# Patient Record
Sex: Male | Born: 1935 | Race: White | Hispanic: No | Marital: Married
Health system: Southern US, Community
[De-identification: ages and names within clinical notes are randomized; demographics above are authoritative.]

---

## 2003-08-02 ENCOUNTER — Encounter: Admission: RE | Admit: 2003-08-02 | Discharge: 2003-08-02 | Payer: Self-pay | Admitting: Specialist

## 2004-02-22 ENCOUNTER — Ambulatory Visit: Payer: Self-pay

## 2004-09-21 ENCOUNTER — Encounter: Admission: RE | Admit: 2004-09-21 | Discharge: 2004-09-21 | Payer: Self-pay | Admitting: Internal Medicine

## 2005-01-22 ENCOUNTER — Ambulatory Visit: Payer: Self-pay | Admitting: Ophthalmology

## 2005-02-07 ENCOUNTER — Ambulatory Visit (HOSPITAL_COMMUNITY): Admission: RE | Admit: 2005-02-07 | Discharge: 2005-02-07 | Payer: Self-pay | Admitting: Internal Medicine

## 2005-03-09 ENCOUNTER — Encounter: Admission: RE | Admit: 2005-03-09 | Discharge: 2005-03-09 | Payer: Self-pay | Admitting: Gastroenterology

## 2005-03-26 ENCOUNTER — Encounter: Admission: RE | Admit: 2005-03-26 | Discharge: 2005-03-26 | Payer: Self-pay | Admitting: Gastroenterology

## 2005-05-28 ENCOUNTER — Ambulatory Visit: Payer: Self-pay | Admitting: Unknown Physician Specialty

## 2006-01-28 ENCOUNTER — Ambulatory Visit: Payer: Self-pay

## 2006-07-12 ENCOUNTER — Ambulatory Visit: Payer: Self-pay | Admitting: Unknown Physician Specialty

## 2006-10-22 ENCOUNTER — Encounter: Admission: RE | Admit: 2006-10-22 | Discharge: 2006-10-22 | Payer: Self-pay | Admitting: Gastroenterology

## 2007-12-15 ENCOUNTER — Emergency Department: Payer: Self-pay

## 2007-12-21 ENCOUNTER — Inpatient Hospital Stay (HOSPITAL_COMMUNITY): Admission: EM | Admit: 2007-12-21 | Discharge: 2007-12-24 | Payer: Self-pay | Admitting: Emergency Medicine

## 2010-05-16 NOTE — H&P (Signed)
NAME:  Brett Haney, Brett Haney                   ACCOUNT NO.:  [id number]   MEDICAL RECORD NO.:  [id number]          PATIENT TYPE:  EMS   LOCATION:  MAJO                         FACILITY:  [facility]   PHYSICIAN:  Corinna L. Lendell Caprice, MDDATE OF BIRTH:  [birth year]   DATE OF ADMISSION:  12/21/2007  DATE OF DISCHARGE:                              HISTORY & PHYSICAL   CHIEF COMPLAINT:  Fall last week.   HISTORY OF PRESENT ILLNESS:  Brett Haney is an 77 year old white male who  presents to the emergency room with an unclear chief complaint.  He is a  difficult historian and I presume demented.  His wife does not really  help much with respect to the story.  I have reviewed an outpatient note  from a few days ago by Dr. Earl Gala.  He reportedly fell last week and  apparently was nauseated last week which apparently is a chronic  problem.  However, when he arrived in the emergency room he was noted to  be febrile.  He denies cough.  He reports anorexia.  He is not currently  nauseated.  He denies shortness of breath.  He denies fevers, chills,  myalgias, vomiting, diarrhea.   PAST MEDICAL HISTORY:  Per outpatient records include hyperlipidemia,  osteoarthritis, anemia, obstructive sleep apnea, noncompliant with CPAP,  gastroparesis, BPH, peripheral neuropathy, hearing loss, lumbar  radiculopathy with lumbar fracture in 1996.   MEDICATIONS PER OUTPATIENT RECORDS:  1. Aspirin 81 mg daily.  2. Xalatan 0.005% one drop to the affected eye once nightly.  3. Alphagan 0.1% one drop to the affected eye t.i.d.  4. Timolol 0.5% one drop to both eyes daily.  5. Restasis 0.05% one drop to affected eye twice a day.  6. Multivitamin daily.  7. Vitamin E 400 units daily.  8. Calcium with vitamin D 600/200 mg twice a day.  9. Glucosamine chondroitin 2 tablets daily.  10.Reglan 5 mg twice a day.  11.Proscar 5 mg a day.  12.Baclofen 20 mg nightly.  13.Kadian 20 mg twice a day.  14.Celebrex 400 mg a day.  15.Provigil 200  mg twice a day.  16.Mirapex 0.5 mg a day.  17.Apparently also domperidone 10 mg six times a day.   SOCIAL HISTORY:  The patient does not smoke.  He is here with his wife.  Apparently there is a nurse in the family with some care.  He drinks  occasionally.   FAMILY HISTORY:  Noncontributory.   REVIEW OF SYSTEMS:  Is limited and difficult due to his dementia.   PHYSICAL EXAMINATION:  VITAL SIGNS:  Temperature is 101.9, blood  pressure 119/66, pulse 100, respiratory rate 18, oxygen saturation 92%  on room air.  GENERAL:  The patient is well-nourished, well-developed in no acute  distress.  HEENT:  Normocephalic, atraumatic.  Pupils equal, round, reactive to  light.  Sclerae nonicteric.  No conjunctivitis.  Moist mucous membranes.  Oropharynx is without erythema or exudate.  NECK:  Neck is supple.  No lymphadenopathy.  No JVD.  LUNGS:  Clear on the left.  He has rales on the right  base.  No wheezes  or rhonchi.  CARDIOVASCULAR:  Regular rate and rhythm without murmurs, gallops or  rubs.  ABDOMEN:  Normal bowel sounds, soft, nontender, nondistended.  GU:  Deferred.  RECTAL:  Deferred.  EXTREMITIES:  No clubbing, cyanosis or edema.  SKIN:  No rash.  Warm.  No diaphoresis.  NEUROLOGIC:  The patient is alert.  He is oriented to person only.  Cranial nerves and sensorimotor exam are intact.  PSYCHIATRIC:  Normal affect, calm and cooperative.   LABS:  White blood cell count 11,000, hemoglobin 11.3, hematocrit 33,  platelet count 130, 92% neutrophils.  Sodium 132, glucose 130, total  protein 5.8, otherwise unremarkable complete metabolic panel.  Urinalysis showed 15 ketones, otherwise negative.   Chest x-ray two views shows right upper lobe and right lower lobe  multifocal pneumonia with tiny parapneumonic effusion, background  emphysematous change.   ASSESSMENT AND PLAN:  1. Pneumonia:  The patient is elderly and apparently fell last week.      He is a difficult historian and it is  reasonable to admit him for      IV antibiotics and to assess further.  He was slightly hypoxic and      I will continue oxygen.  2. Obstructive sleep apnea.  3. Osteoarthritis.  4. Presumed dementia.  5. Gastroparesis.  Continue Reglan, give Zofran as needed.  6. Benign prostatic hypertrophy.      Corinna L. Lendell Caprice, MD  Electronically Signed     CLS/MEDQ  D:  12/21/2007  T:  12/21/2007  Job:  [id number]

## 2010-05-16 NOTE — Discharge Summary (Signed)
NAME:  Brett Haney, Brett Haney                   ACCOUNT NO.:  [id number]   MEDICAL RECORD NO.:  [id number]          PATIENT TYPE:  INP   LOCATION:  5149                         FACILITY:  [facility]   PHYSICIAN:  Theressa Millard, M.D.    DATE OF BIRTH:  [birth year]   DATE OF ADMISSION:  12/21/2007  DATE OF DISCHARGE:  12/24/2007                               DISCHARGE SUMMARY   ADMITTING DIAGNOSIS:  Pneumonia.   DISCHARGE DIAGNOSES:  1. Community-acquired pneumonia, organism unknown.  2. Delirium secondary to community-acquired pneumonia and/or Avelox.  3. Memory los, (?) secondary to medications, (?) secondary to      obstructive sleep apnea, (?) secondary to early dementing process      or combination of the above.  4. Severe idiopathic peripheral neuropathy.  5. Osteoarthritis.  6. Benign prostatic hypertrophy.  7. Severe obstructive sleep apnea, currently untreated.  8. Mild anemia.   The patient is an 77 year old white male who was admitted with  pneumonia.  He had increased confusion and fever, and he was brought to  Emergency Department.  He had a fever of 101.9 and hypoxemia that  correct with oxygen.   HOSPITAL COURSE:  The patient was admitted and blood cultures were  negative.  White count was never very elevated.  He never developed a  cough.  However, oxygenation improved considerably on Avelox.  He did  get a little more confused and it was unclear whether this was simply  due to being hospitalized on the top of some baseline dementia or  whether he had side effects of Avelox.  Avelox was discontinued and  changed to azithromycin and Ceftin.  He improved, but was still somewhat  confused.  He was continued on antibiotics and was discharged improved  condition.   In the office visit prior to his admission, it had been noted that he  was on Provigil 200 mg b.i.d. and his had been decreased to once a day.  He has been on a number of medicines that could affect cognition and we  were  beginning to work on possible underlying causes for cognitive  dysfunction including medications, sleep apnea, and an early dementing  process.   DISCHARGE MEDICATIONS:  1. Aspirin 81 mg daily.  2. Xalatan 0.005% 1 drop both eyes at bedtime.  3. Alphagan 0.1% 1 drop both eyes b.i.d.  4. Timolol 0.5% 1 drop both eyes daily.  5. Restasis 0.05% 1 drop both eyes b.i.d.  6. Multivitamin daily.  7. Vitamin E 400 units daily.  8. Calcium plus vitamin D 600/200 twice daily.  9. Glucosamine/chondroitin 2 tablets daily.  10.Reglan 5 mg b.i.d.  11.Proscar 5 mg daily.  12.Baclofen 20 mg at bedtime.  13.Kadian 20 mg b.i.d.  14.Celebrex 400 mg daily.  15.Provigil 200 mg daily.  16.Mirapex 0.5 mg daily.  17.Domperidone 10 mg 6 times a day.  18.Nexium 4 mg daily.  19.Ambien 10 mg nightly p.r.n.  20.Azithromycin 250 mg 1 tablet daily times 3 days.  21.Ceftin 250 mg 1 tablet b.i.d. x5 days.   FOLLOWUP:  He will call to  make an appointment to see me in the next  month and will do a followup chest x-ray at that time.  He has an  appointment with Dr. Adriana Simas, his neurologist, in [place] in early January  2010 as well.   ACTIVITY:  As tolerated.   DIET:  No added salt.      Theressa Millard, M.D.  Electronically Signed     JO/MEDQ  D:  12/24/2007  T:  12/24/2007  Job:  [id number]

## 2010-10-06 LAB — URINALYSIS, ROUTINE W REFLEX MICROSCOPIC
Glucose, UA: NEGATIVE mg/dL
Nitrite: NEGATIVE
Specific Gravity, Urine: 1.017 (ref 1.005–1.030)
Urobilinogen, UA: 0.2 mg/dL (ref 0.0–1.0)
pH: 8 (ref 5.0–8.0)

## 2010-10-06 LAB — CBC
HCT: 27.1 % — ABNORMAL LOW (ref 39.0–52.0)
HCT: 29.3 % — ABNORMAL LOW (ref 39.0–52.0)
HCT: 33.1 % — ABNORMAL LOW (ref 39.0–52.0)
Hemoglobin: 10.1 g/dL — ABNORMAL LOW (ref 13.0–17.0)
Hemoglobin: 9.5 g/dL — ABNORMAL LOW (ref 13.0–17.0)
MCHC: 34.4 g/dL (ref 30.0–36.0)
MCHC: 35.1 g/dL (ref 30.0–36.0)
MCV: 87 fL (ref 78.0–100.0)
MCV: 88.3 fL (ref 78.0–100.0)
MCV: 88.5 fL (ref 78.0–100.0)
Platelets: 114 10*3/uL — ABNORMAL LOW (ref 150–400)
Platelets: 116 10*3/uL — ABNORMAL LOW (ref 150–400)
Platelets: 130 10*3/uL — ABNORMAL LOW (ref 150–400)
RBC: 3.12 MIL/uL — ABNORMAL LOW (ref 4.22–5.81)
RBC: 3.31 MIL/uL — ABNORMAL LOW (ref 4.22–5.81)
RBC: 3.75 MIL/uL — ABNORMAL LOW (ref 4.22–5.81)
RDW: 14.5 % (ref 11.5–15.5)
RDW: 14.7 % (ref 11.5–15.5)
WBC: 7.4 10*3/uL (ref 4.0–10.5)

## 2010-10-06 LAB — FOLATE RBC: RBC Folate: 1684 ng/mL — ABNORMAL HIGH (ref 180–600)

## 2010-10-06 LAB — COMPREHENSIVE METABOLIC PANEL
ALT: 21 U/L (ref 0–53)
BUN: 21 mg/dL (ref 6–23)
CO2: 25 mEq/L (ref 19–32)
Calcium: 8.6 mg/dL (ref 8.4–10.5)
Creatinine, Ser: 0.92 mg/dL (ref 0.4–1.5)
Glucose, Bld: 130 mg/dL — ABNORMAL HIGH (ref 70–99)
Sodium: 132 mEq/L — ABNORMAL LOW (ref 135–145)
Total Bilirubin: 1 mg/dL (ref 0.3–1.2)

## 2010-10-06 LAB — CULTURE, BLOOD (ROUTINE X 2)
Culture: NO GROWTH
Culture: NO GROWTH

## 2010-10-06 LAB — FERRITIN: Ferritin: 174 ng/mL (ref 22–322)

## 2010-10-06 LAB — DIFFERENTIAL
Eosinophils Absolute: 0 10*3/uL (ref 0.0–0.7)
Eosinophils Relative: 0 % (ref 0–5)
Lymphocytes Relative: 4 % — ABNORMAL LOW (ref 12–46)
Monocytes Absolute: 0.4 10*3/uL (ref 0.1–1.0)
Monocytes Relative: 4 % (ref 3–12)
Neutro Abs: 10.2 10*3/uL — ABNORMAL HIGH (ref 1.7–7.7)
Neutrophils Relative %: 92 % — ABNORMAL HIGH (ref 43–77)

## 2010-10-06 LAB — BASIC METABOLIC PANEL
BUN: 20 mg/dL (ref 6–23)
GFR calc non Af Amer: >60 mL/min (ref >60)
Glucose, Bld: 164 mg/dL — ABNORMAL HIGH (ref 70–99)
Potassium: 3.5 mEq/L (ref 3.5–5.1)

## 2010-10-06 LAB — IRON AND TIBC
Iron: 19 ug/dL — ABNORMAL LOW (ref 42–135)
Saturation Ratios: 9 % — ABNORMAL LOW (ref 20–55)
TIBC: 219 ug/dL (ref 215–435)
UIBC: 200 ug/dL

## 2010-10-06 LAB — TSH: TSH: 1.215 u[IU]/mL (ref 0.350–4.500)

## 2010-10-06 LAB — VITAMIN B12: Vitamin B-12: 301 pg/mL (ref 211–911)

## 2011-10-23 ENCOUNTER — Inpatient Hospital Stay: Payer: Self-pay | Admitting: General Practice

## 2011-10-23 LAB — COMPREHENSIVE METABOLIC PANEL
Albumin: 3.5 g/dL (ref 3.4–5.0)
Alkaline Phosphatase: 121 U/L (ref 50–136)
Anion Gap: 7 (ref 7–16)
BUN: 25 mg/dL — ABNORMAL HIGH (ref 7–18)
Calcium, Total: 8.3 mg/dL — ABNORMAL LOW (ref 8.5–10.1)
Co2: 29 mmol/L (ref 21–32)
EGFR (Non-African Amer.): 59 — ABNORMAL LOW
Glucose: 112 mg/dL — ABNORMAL HIGH (ref 65–99)
Osmolality: 290 (ref 275–301)
Potassium: 4.1 mmol/L (ref 3.5–5.1)
SGOT(AST): 25 U/L (ref 15–37)
SGPT (ALT): 20 U/L (ref 12–78)
Sodium: 143 mmol/L (ref 136–145)

## 2011-10-23 LAB — CBC
HCT: 29.7 % — ABNORMAL LOW (ref 40.0–52.0)
HGB: 10.3 g/dL — ABNORMAL LOW (ref 13.0–18.0)
MCH: 30.3 pg (ref 26.0–34.0)
MCV: 87 fL (ref 80–100)
Platelet: 119 10*3/uL — ABNORMAL LOW (ref 150–440)
RBC: 3.42 10*6/uL — ABNORMAL LOW (ref 4.40–5.90)

## 2011-10-23 LAB — PROTIME-INR
INR: 1
Prothrombin Time: 14 secs (ref 11.5–14.7)

## 2011-10-25 LAB — BASIC METABOLIC PANEL
BUN: 16 mg/dL (ref 7–18)
Calcium, Total: 7.4 mg/dL — ABNORMAL LOW (ref 8.5–10.1)
EGFR (African American): >60
EGFR (Non-African Amer.): >60
Glucose: 108 mg/dL — ABNORMAL HIGH (ref 65–99)
Osmolality: 290 (ref 275–301)
Sodium: 145 mmol/L (ref 136–145)

## 2011-10-25 LAB — PLATELET COUNT: Platelet: 98 10*3/uL — ABNORMAL LOW (ref 150–440)

## 2011-10-26 LAB — BASIC METABOLIC PANEL
Calcium, Total: 7.5 mg/dL — ABNORMAL LOW (ref 8.5–10.1)
EGFR (Non-African Amer.): >60
Glucose: 94 mg/dL (ref 65–99)
Osmolality: 292 (ref 275–301)
Potassium: 3.2 mmol/L — ABNORMAL LOW (ref 3.5–5.1)

## 2011-10-26 LAB — MAGNESIUM: Magnesium: 1.5 mg/dL — ABNORMAL LOW

## 2011-10-27 LAB — CBC WITH DIFFERENTIAL/PLATELET
Basophil #: 0.1 10*3/uL (ref 0.0–0.1)
Basophil %: 1 %
HCT: 19.8 % — ABNORMAL LOW (ref 40.0–52.0)
HGB: 7.2 g/dL — ABNORMAL LOW (ref 13.0–18.0)
Lymphocyte #: 1.1 10*3/uL (ref 1.0–3.6)
Lymphocyte %: 19.5 %
MCHC: 36.2 g/dL — ABNORMAL HIGH (ref 32.0–36.0)
MCV: 85 fL (ref 80–100)
Monocyte %: 5.4 %
Neutrophil #: 3.8 10*3/uL (ref 1.4–6.5)
RBC: 2.33 10*6/uL — ABNORMAL LOW (ref 4.40–5.90)
RDW: 14.5 % (ref 11.5–14.5)
WBC: 5.4 10*3/uL (ref 3.8–10.6)

## 2011-10-27 LAB — BASIC METABOLIC PANEL
Anion Gap: 9 (ref 7–16)
BUN: 10 mg/dL (ref 7–18)
Chloride: 109 mmol/L — ABNORMAL HIGH (ref 98–107)
Co2: 25 mmol/L (ref 21–32)
Creatinine: 1.02 mg/dL (ref 0.60–1.30)
EGFR (Non-African Amer.): >60
Glucose: 112 mg/dL — ABNORMAL HIGH (ref 65–99)
Osmolality: 285 (ref 275–301)
Potassium: 3.2 mmol/L — ABNORMAL LOW (ref 3.5–5.1)
Sodium: 143 mmol/L (ref 136–145)

## 2011-10-28 LAB — BASIC METABOLIC PANEL
Anion Gap: 7 (ref 7–16)
BUN: 9 mg/dL (ref 7–18)
Chloride: 110 mmol/L — ABNORMAL HIGH (ref 98–107)
Co2: 27 mmol/L (ref 21–32)
Creatinine: 0.85 mg/dL (ref 0.60–1.30)
Potassium: 3.4 mmol/L — ABNORMAL LOW (ref 3.5–5.1)
Sodium: 144 mmol/L (ref 136–145)

## 2011-10-28 LAB — HEMOGLOBIN: HGB: 8 g/dL — ABNORMAL LOW (ref 13.0–18.0)

## 2011-10-29 LAB — BASIC METABOLIC PANEL
Anion Gap: 9 (ref 7–16)
BUN: 15 mg/dL (ref 7–18)
Co2: 27 mmol/L (ref 21–32)
Creatinine: 0.82 mg/dL (ref 0.60–1.30)
EGFR (African American): >60
Glucose: 108 mg/dL — ABNORMAL HIGH (ref 65–99)
Osmolality: 286 (ref 275–301)
Sodium: 143 mmol/L (ref 136–145)

## 2011-10-29 LAB — HEMOGLOBIN: HGB: 8.1 g/dL — ABNORMAL LOW (ref 13.0–18.0)

## 2011-10-30 DIAGNOSIS — S7290XA Unspecified fracture of unspecified femur, initial encounter for closed fracture: Secondary | ICD-10-CM

## 2011-10-30 DIAGNOSIS — F068 Other specified mental disorders due to known physiological condition: Secondary | ICD-10-CM

## 2011-10-30 DIAGNOSIS — G589 Mononeuropathy, unspecified: Secondary | ICD-10-CM

## 2011-10-30 DIAGNOSIS — K219 Gastro-esophageal reflux disease without esophagitis: Secondary | ICD-10-CM

## 2011-12-27 DIAGNOSIS — F0280 Dementia in other diseases classified elsewhere without behavioral disturbance: Secondary | ICD-10-CM

## 2011-12-27 DIAGNOSIS — G309 Alzheimer's disease, unspecified: Secondary | ICD-10-CM

## 2011-12-27 DIAGNOSIS — G589 Mononeuropathy, unspecified: Secondary | ICD-10-CM

## 2011-12-27 DIAGNOSIS — K219 Gastro-esophageal reflux disease without esophagitis: Secondary | ICD-10-CM

## 2011-12-27 DIAGNOSIS — N400 Enlarged prostate without lower urinary tract symptoms: Secondary | ICD-10-CM

## 2012-01-24 DIAGNOSIS — G309 Alzheimer's disease, unspecified: Secondary | ICD-10-CM

## 2012-01-24 DIAGNOSIS — F0280 Dementia in other diseases classified elsewhere without behavioral disturbance: Secondary | ICD-10-CM

## 2012-01-24 DIAGNOSIS — K219 Gastro-esophageal reflux disease without esophagitis: Secondary | ICD-10-CM

## 2012-01-24 DIAGNOSIS — G589 Mononeuropathy, unspecified: Secondary | ICD-10-CM

## 2012-01-24 DIAGNOSIS — N400 Enlarged prostate without lower urinary tract symptoms: Secondary | ICD-10-CM

## 2012-03-20 DIAGNOSIS — F0280 Dementia in other diseases classified elsewhere without behavioral disturbance: Secondary | ICD-10-CM

## 2012-03-20 DIAGNOSIS — N400 Enlarged prostate without lower urinary tract symptoms: Secondary | ICD-10-CM

## 2012-03-20 DIAGNOSIS — K219 Gastro-esophageal reflux disease without esophagitis: Secondary | ICD-10-CM

## 2012-03-20 DIAGNOSIS — G589 Mononeuropathy, unspecified: Secondary | ICD-10-CM

## 2012-03-20 DIAGNOSIS — G309 Alzheimer's disease, unspecified: Secondary | ICD-10-CM

## 2012-05-13 DIAGNOSIS — G479 Sleep disorder, unspecified: Secondary | ICD-10-CM

## 2012-06-04 ENCOUNTER — Ambulatory Visit: Payer: Self-pay | Admitting: Internal Medicine

## 2012-07-10 DIAGNOSIS — F22 Delusional disorders: Secondary | ICD-10-CM

## 2012-07-10 DIAGNOSIS — F29 Unspecified psychosis not due to a substance or known physiological condition: Secondary | ICD-10-CM

## 2012-09-09 DIAGNOSIS — G589 Mononeuropathy, unspecified: Secondary | ICD-10-CM

## 2012-09-09 DIAGNOSIS — F0390 Unspecified dementia without behavioral disturbance: Secondary | ICD-10-CM

## 2012-09-09 DIAGNOSIS — F0280 Dementia in other diseases classified elsewhere without behavioral disturbance: Secondary | ICD-10-CM

## 2012-09-09 DIAGNOSIS — N400 Enlarged prostate without lower urinary tract symptoms: Secondary | ICD-10-CM

## 2012-09-09 DIAGNOSIS — G309 Alzheimer's disease, unspecified: Secondary | ICD-10-CM

## 2012-11-02 IMAGING — CR RIGHT HIP - COMPLETE 2+ VIEW
1 series · 3 of 3 positions shown · non-contrast
Comparison: none

REASON FOR EXAM: pain, injury, fall
COMMENTS:

[Series 1: t hip ap right · 0.14mm/px · 3 of 3 slices shown]
[im 1/3]
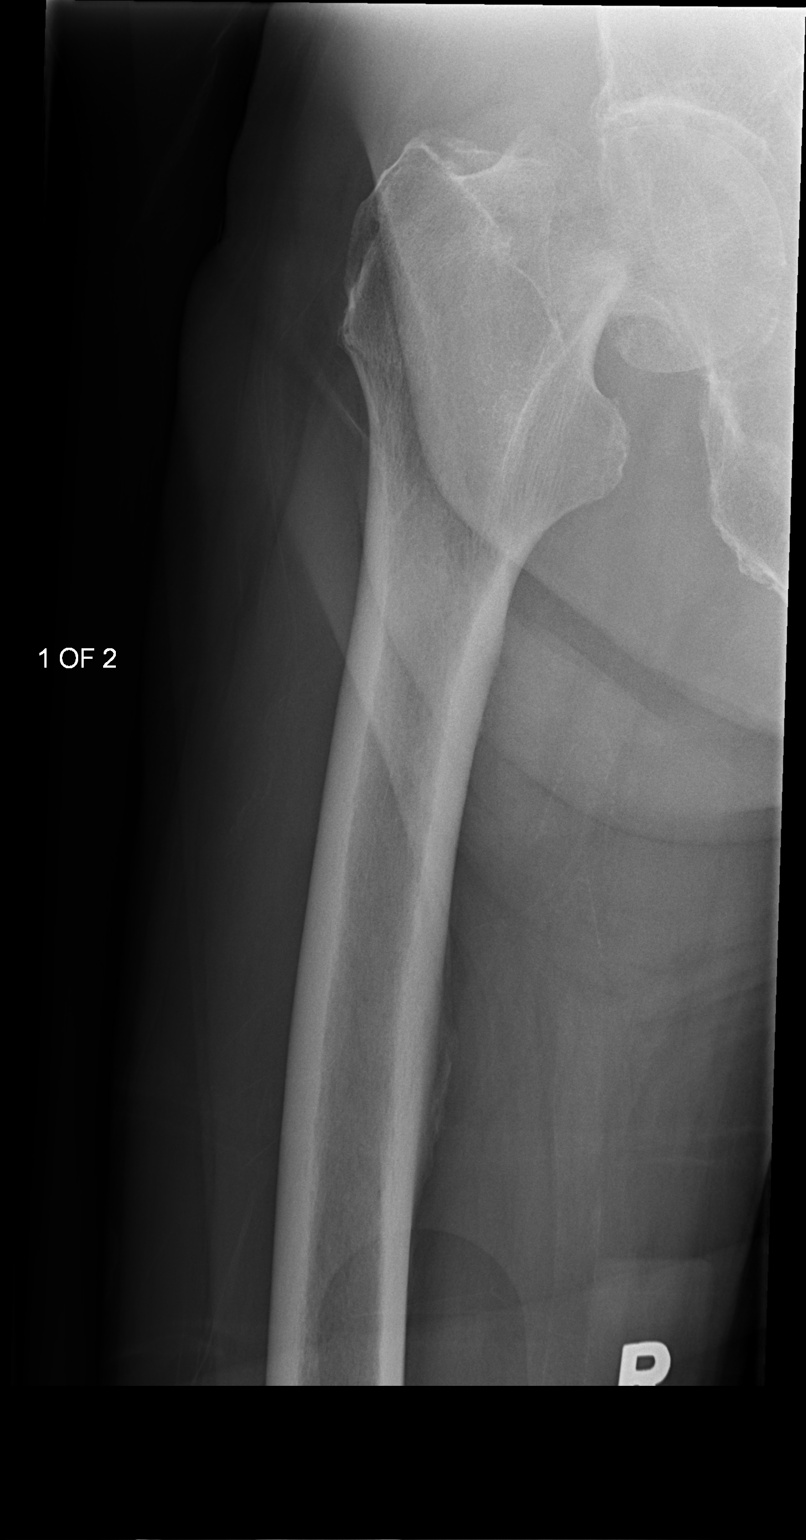
[im 2/3]
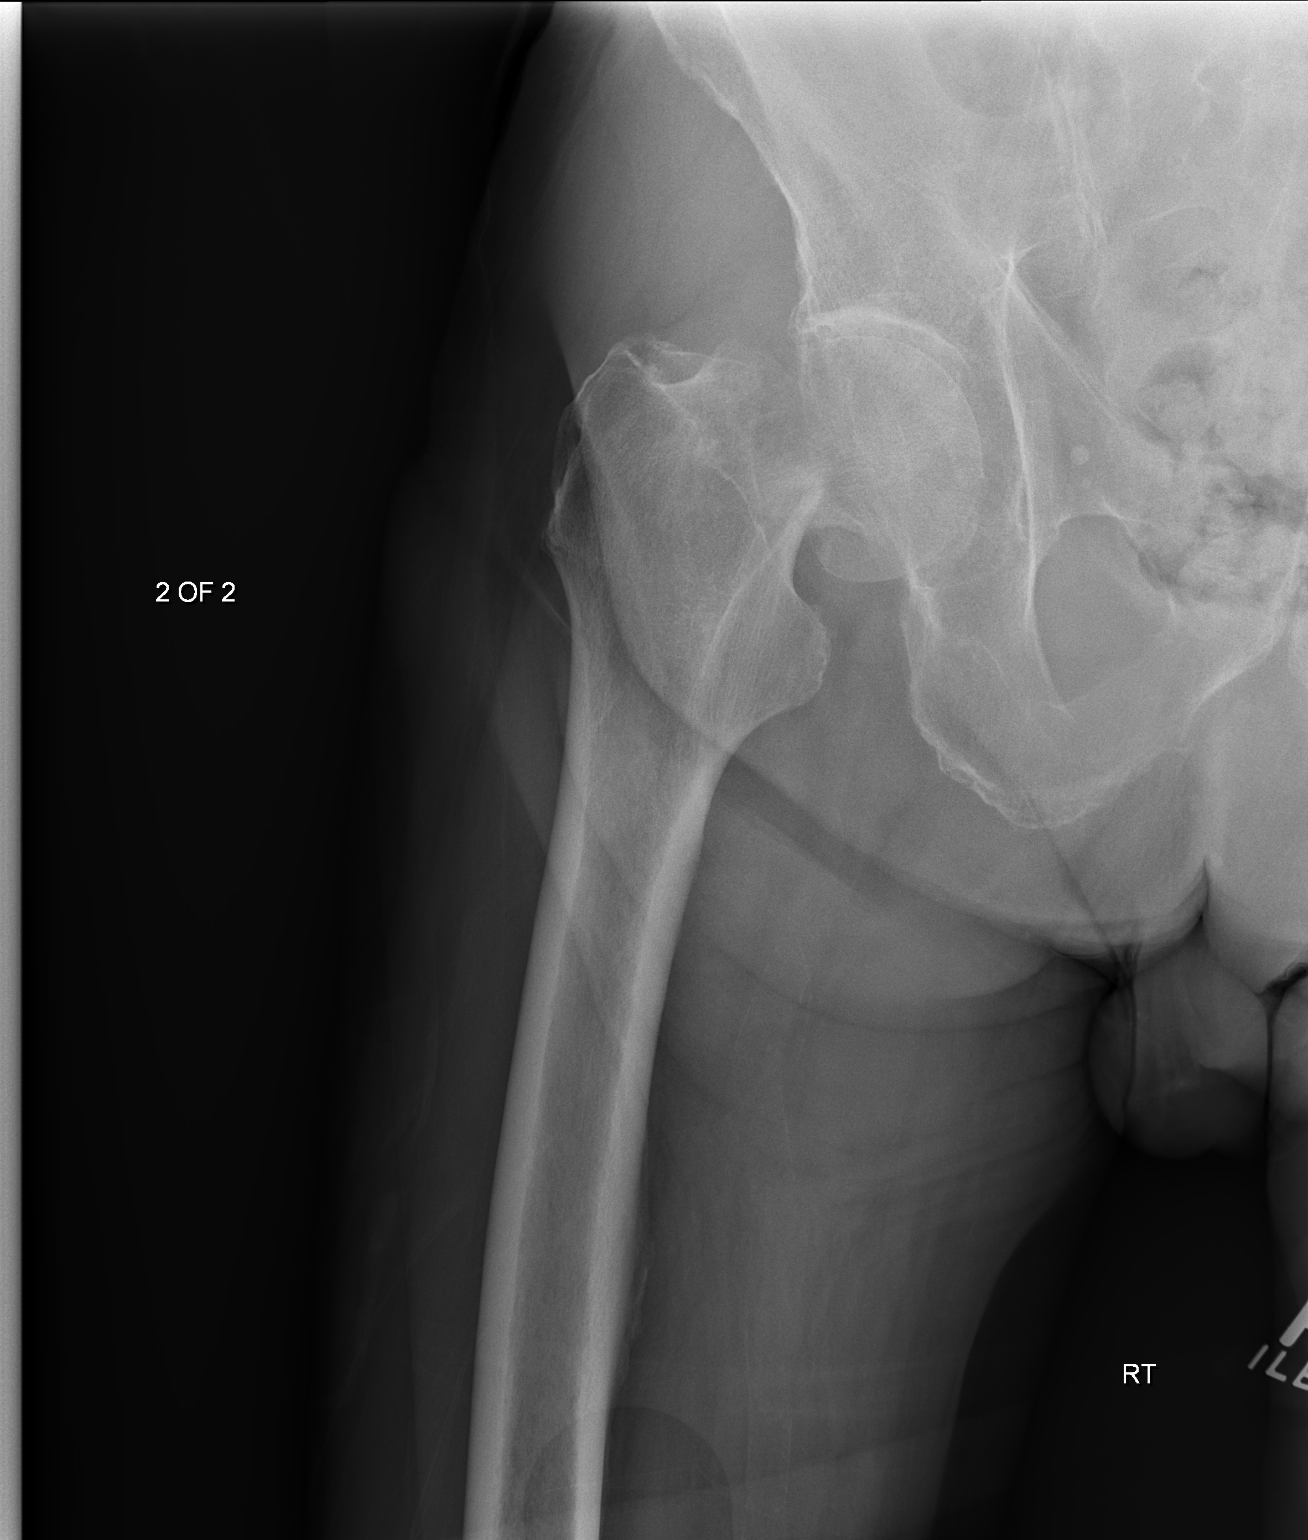
[im 3/3]
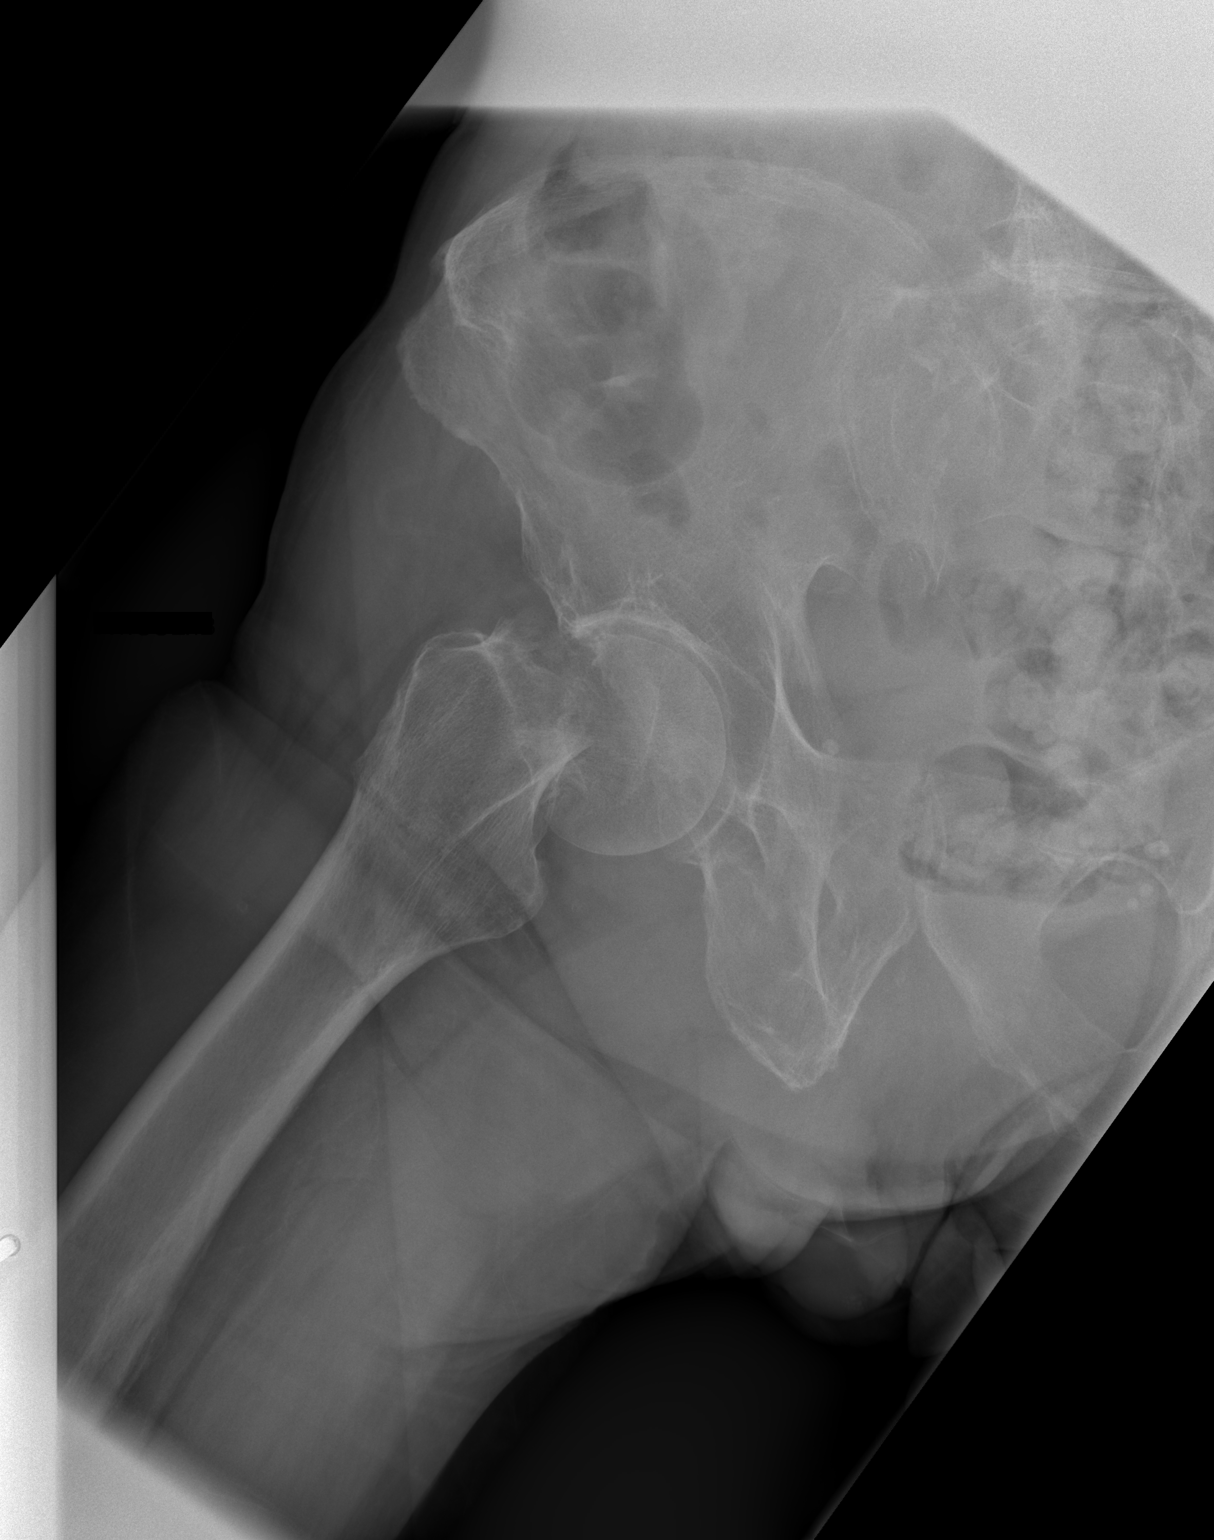

[3 of 3 positions shown; findings below may reference images not displayed]

PROCEDURE:     DXR - DXR HIP RIGHT COMPLETE  - October 23, 2011  [DATE]

RESULT:     Three views of the right hip are submitted. The patient has
sustained an acute angulated subcapital fracture. The intertrochanteric
region is intact. The observed portions of the right hemipelvis exhibit no
acute fractures. There is diffuse osteopenia.
IMPRESSION: The patient has sustained an acute subcapital fracture of
the right hip.

[REDACTED]

## 2012-11-03 IMAGING — CR RIGHT HIP - COMPLETE 2+ VIEW
1 series · 2 of 2 positions shown · non-contrast
Comparison: none

REASON FOR EXAM: s/p THA
COMMENTS:   Bedside (portable):Y

[Series 1: ap · 0.17mm/px · 2 of 2 slices shown]
[im 1/2]
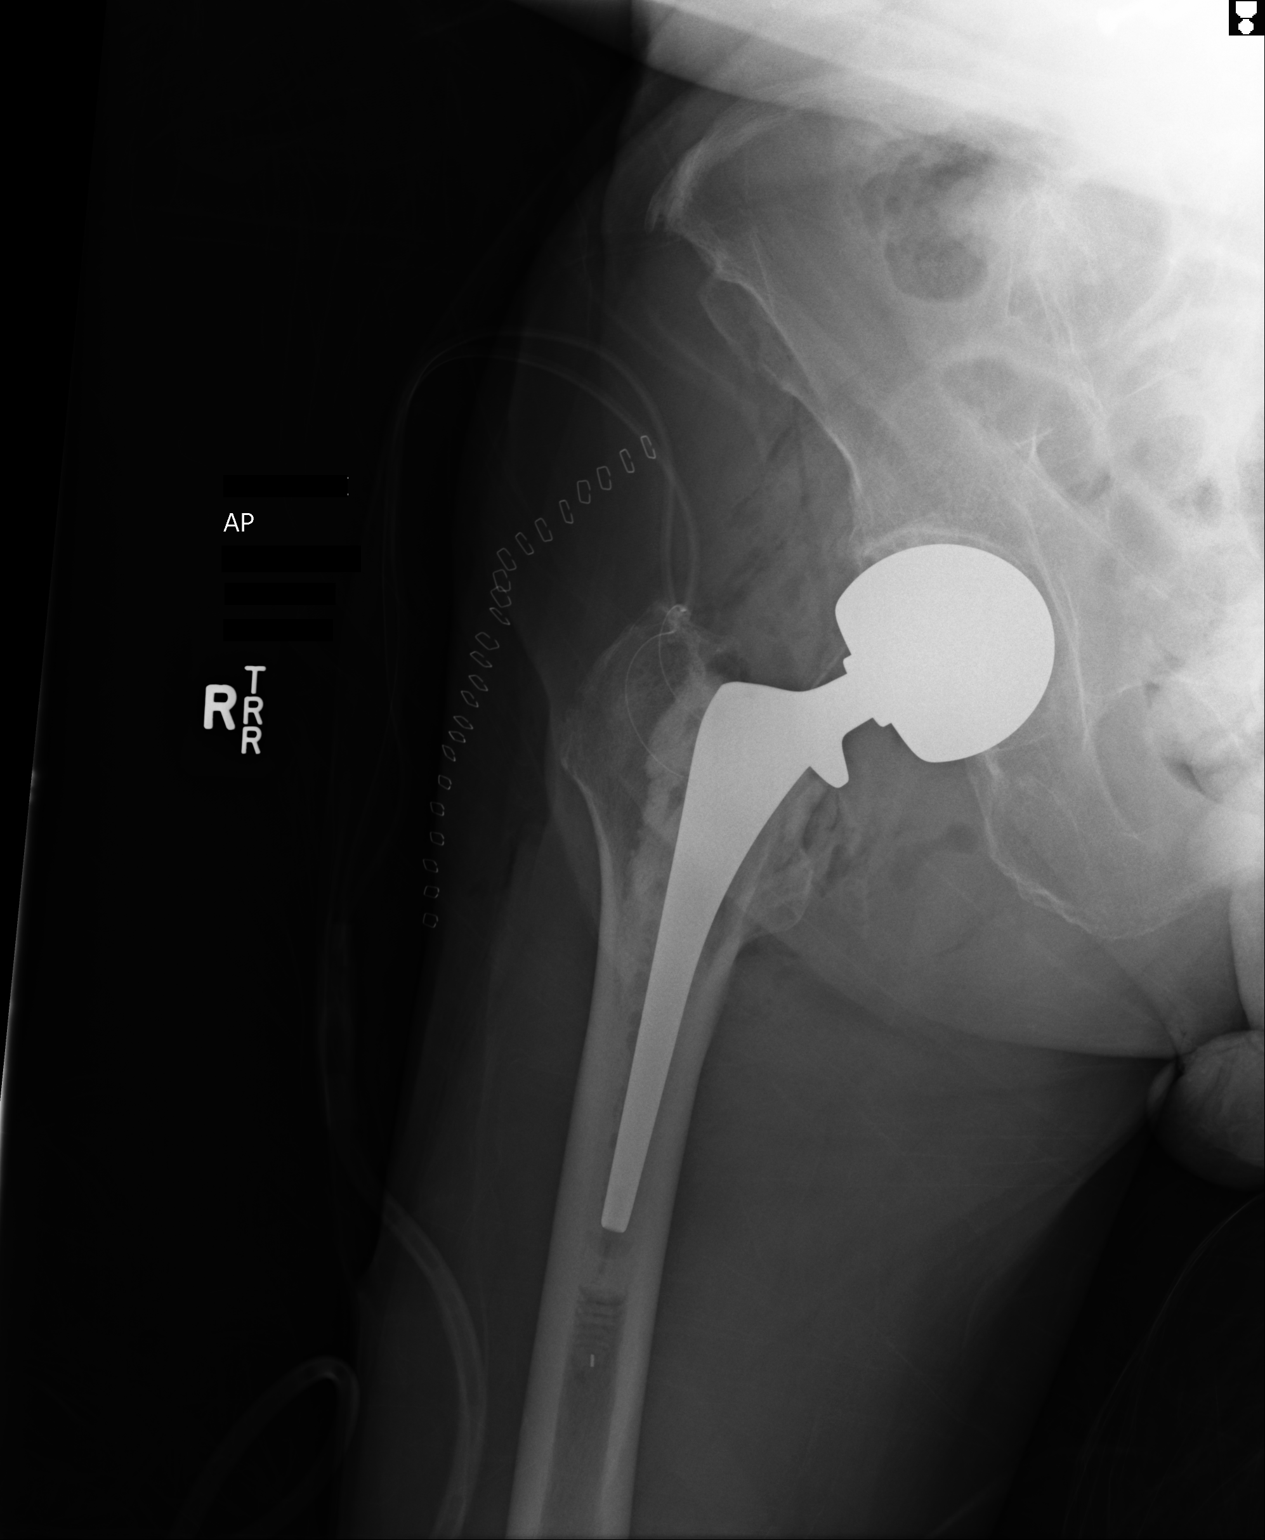
[im 2/2]
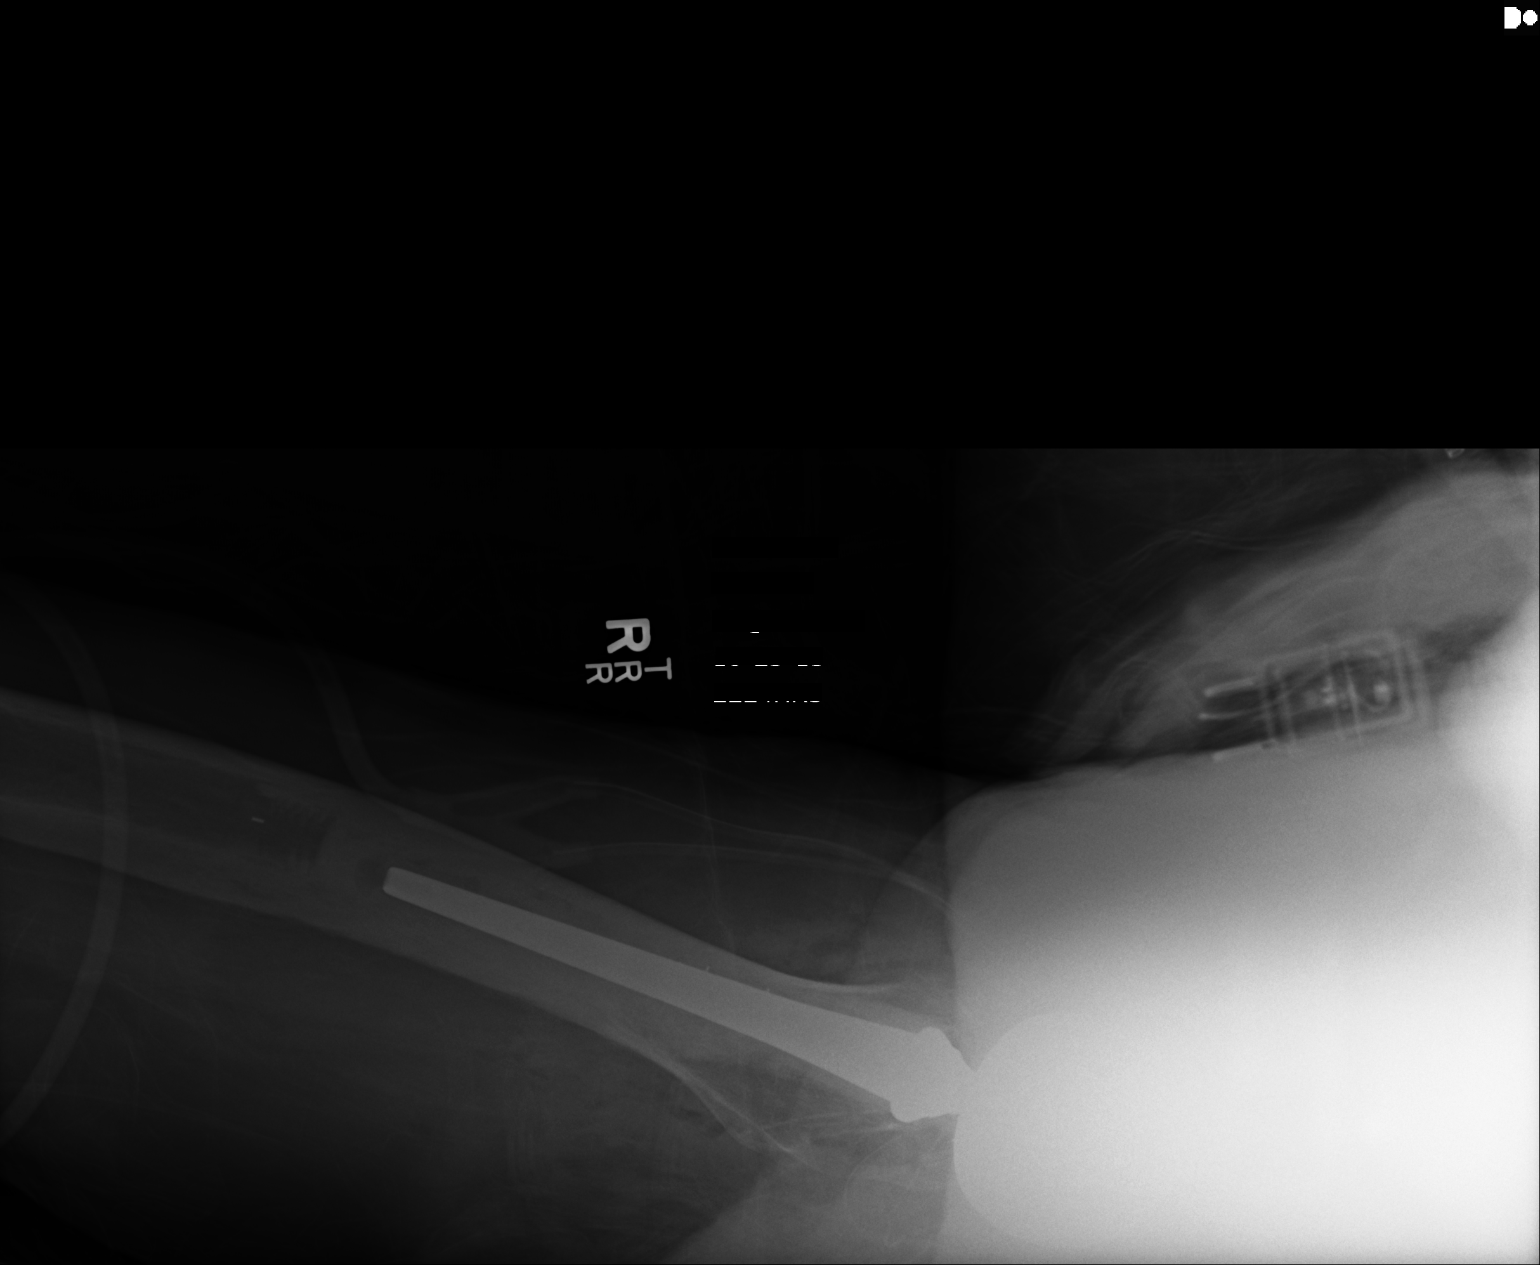

[2 of 2 positions shown; findings below may reference images not displayed]

PROCEDURE:     DXR - DXR HIP RIGHT COMPLETE  - October 24, 2011 [DATE]

RESULT:     AP and lateral views of the right hip reveal the patient to have
undergone total joint prosthesis placement. Radiographic positioning of the
prosthetic components there is good. Surgical drain lines and skin staples
are present.
IMPRESSION: The patient has undergone right hip prosthesis placement.
Further interpretation is deferred to Dr. Ney.

[REDACTED]

## 2012-11-05 DIAGNOSIS — G894 Chronic pain syndrome: Secondary | ICD-10-CM

## 2012-11-05 DIAGNOSIS — F0281 Dementia in other diseases classified elsewhere with behavioral disturbance: Secondary | ICD-10-CM

## 2012-11-05 DIAGNOSIS — N400 Enlarged prostate without lower urinary tract symptoms: Secondary | ICD-10-CM

## 2012-11-05 DIAGNOSIS — N052 Unspecified nephritic syndrome with diffuse membranous glomerulonephritis: Secondary | ICD-10-CM

## 2012-11-05 DIAGNOSIS — F22 Delusional disorders: Secondary | ICD-10-CM

## 2012-12-26 DIAGNOSIS — R509 Fever, unspecified: Secondary | ICD-10-CM

## 2012-12-29 DIAGNOSIS — IMO0002 Reserved for concepts with insufficient information to code with codable children: Secondary | ICD-10-CM

## 2013-01-13 DIAGNOSIS — L97409 Non-pressure chronic ulcer of unspecified heel and midfoot with unspecified severity: Secondary | ICD-10-CM

## 2013-01-22 DIAGNOSIS — F0280 Dementia in other diseases classified elsewhere without behavioral disturbance: Secondary | ICD-10-CM

## 2013-01-22 DIAGNOSIS — N400 Enlarged prostate without lower urinary tract symptoms: Secondary | ICD-10-CM

## 2013-01-22 DIAGNOSIS — F22 Delusional disorders: Secondary | ICD-10-CM

## 2013-01-22 DIAGNOSIS — K219 Gastro-esophageal reflux disease without esophagitis: Secondary | ICD-10-CM

## 2013-01-22 DIAGNOSIS — G309 Alzheimer's disease, unspecified: Secondary | ICD-10-CM

## 2013-02-09 DIAGNOSIS — L97409 Non-pressure chronic ulcer of unspecified heel and midfoot with unspecified severity: Secondary | ICD-10-CM

## 2013-02-20 DIAGNOSIS — R238 Other skin changes: Secondary | ICD-10-CM

## 2013-03-04 DIAGNOSIS — L03119 Cellulitis of unspecified part of limb: Secondary | ICD-10-CM

## 2013-03-04 DIAGNOSIS — T148XXA Other injury of unspecified body region, initial encounter: Secondary | ICD-10-CM

## 2013-03-04 DIAGNOSIS — L02419 Cutaneous abscess of limb, unspecified: Secondary | ICD-10-CM

## 2013-03-11 DIAGNOSIS — F22 Delusional disorders: Secondary | ICD-10-CM

## 2013-03-11 DIAGNOSIS — IMO0002 Reserved for concepts with insufficient information to code with codable children: Secondary | ICD-10-CM

## 2013-03-11 DIAGNOSIS — G309 Alzheimer's disease, unspecified: Secondary | ICD-10-CM

## 2013-03-11 DIAGNOSIS — N400 Enlarged prostate without lower urinary tract symptoms: Secondary | ICD-10-CM

## 2013-03-11 DIAGNOSIS — F0280 Dementia in other diseases classified elsewhere without behavioral disturbance: Secondary | ICD-10-CM

## 2013-03-11 DIAGNOSIS — N052 Unspecified nephritic syndrome with diffuse membranous glomerulonephritis: Secondary | ICD-10-CM

## 2013-05-14 DIAGNOSIS — F22 Delusional disorders: Secondary | ICD-10-CM

## 2013-05-14 DIAGNOSIS — G589 Mononeuropathy, unspecified: Secondary | ICD-10-CM

## 2013-05-14 DIAGNOSIS — K219 Gastro-esophageal reflux disease without esophagitis: Secondary | ICD-10-CM

## 2013-05-14 DIAGNOSIS — G309 Alzheimer's disease, unspecified: Secondary | ICD-10-CM

## 2013-05-14 DIAGNOSIS — F0280 Dementia in other diseases classified elsewhere without behavioral disturbance: Secondary | ICD-10-CM

## 2013-05-14 DIAGNOSIS — N400 Enlarged prostate without lower urinary tract symptoms: Secondary | ICD-10-CM

## 2013-07-01 DIAGNOSIS — M7989 Other specified soft tissue disorders: Secondary | ICD-10-CM

## 2013-07-15 DIAGNOSIS — K5900 Constipation, unspecified: Secondary | ICD-10-CM

## 2013-07-15 DIAGNOSIS — G309 Alzheimer's disease, unspecified: Secondary | ICD-10-CM

## 2013-07-15 DIAGNOSIS — F0280 Dementia in other diseases classified elsewhere without behavioral disturbance: Secondary | ICD-10-CM

## 2013-07-15 DIAGNOSIS — G8929 Other chronic pain: Secondary | ICD-10-CM

## 2013-07-15 DIAGNOSIS — F22 Delusional disorders: Secondary | ICD-10-CM

## 2013-07-15 DIAGNOSIS — N052 Unspecified nephritic syndrome with diffuse membranous glomerulonephritis: Secondary | ICD-10-CM

## 2013-07-15 DIAGNOSIS — N400 Enlarged prostate without lower urinary tract symptoms: Secondary | ICD-10-CM

## 2013-09-10 DIAGNOSIS — F0280 Dementia in other diseases classified elsewhere without behavioral disturbance: Secondary | ICD-10-CM

## 2013-09-10 DIAGNOSIS — R509 Fever, unspecified: Secondary | ICD-10-CM

## 2013-09-10 DIAGNOSIS — G309 Alzheimer's disease, unspecified: Secondary | ICD-10-CM

## 2013-09-14 ENCOUNTER — Telehealth: Payer: Self-pay | Admitting: Family Medicine

## 2013-09-14 NOTE — Telephone Encounter (Signed)
This was expected 

## 2013-09-14 NOTE — Telephone Encounter (Signed)
Confidential Office Message [address] [place], [place] [address] p. [phone] f. [phone] To: Gar Gibbon (After Hours Triage) Fax: [phone] From: [facility] Date/ Time: 01-Oct-2013 12:50 AM Taken By: Claudie Leach, RN Caller: Bonita Quin Facility: [facility] Patient: Brett Haney, Brett Haney DOB: 27-Apr-1922 Phone: [phone] Reason for Call: Bonita Quin is calling from [facility] regarding the death of [name]. Patient of [facility]). The patient expired on October 01, 2013 at 00:30. Regarding Appointment: Appt Date: Appt Time: Unknown Provider: Reason: Details: Confidential Outcome:

## 2013-10-01 DEATH — deceased

## 2014-04-20 NOTE — Consult Note (Signed)
PATIENT NAME:  Brett Haney, Brett Haney MR#:  [id number] DATE OF BIRTH:  July 08, 1922  DATE OF CONSULTATION:  10/23/2011  REFERRING PHYSICIAN:  Francesco Sor, MD CONSULTING PHYSICIAN:  Rolly Pancake. Cherlynn Kaiser, MD  PRIMARY CARE PHYSICIAN: Dr. Jon Billings [facility]  REASON FOR CONSULTATION: Preoperative evaluation and medical management.   HISTORY OF PRESENT ILLNESS: This is an 80 year old male who presented to the Emergency Room after suffering a mechanical fall earlier. The patient was noted to have a right hip fracture. The patient says that he was getting out of the car going over the curb when he tripped and fell. He denied any prodromal symptoms prior to the fall or any chest pain, palpitations, dizziness, shortness of breath, nausea, vomiting, chest pain, or other associated symptoms. As mentioned, the patient came to the ER after a fall and was noted to have a right hip fracture. Hospitalist services were contacted for preoperative evaluation and medical management.   REVIEW OF SYSTEMS: CONSTITUTIONAL: No documented fever. No weight gain or weight loss. EYES: No blurry or double vision. ENT: No tinnitus. No postnasal drip. No redness of the oropharynx. RESPIRATORY: No cough, no wheeze, and no hemoptysis. CARDIOVASCULAR: No chest pain, no orthopnea, no palpitations, and no syncope. GASTROINTESTINAL: No nausea, no vomiting, no diarrhea, no abdominal pain, no melena, and no hematochezia. GU: No dysuria or hematuria. ENDOCRINE: No polyuria or nocturia. No heat or cold intolerance. HEME: No anemia, no bruising, and no bleeding. INTEGUMENTARY: No rashes. No lesions. MUSCULOSKELETAL: No arthritis, no swelling, and no gout. NEUROLOGIC: No numbness, tingling, or ataxia. No seizure-type activity. PSYCH: No anxiety, no insomnia, and no ADD.   PAST MEDICAL HISTORY:  1. Dementia.  2. Benign prostatic hypertrophy.  3. Chronic pain secondary to scoliosis.  4. Gastroesophageal reflux disease.  5. Restless leg  syndrome. 6. Glaucoma. 7. Narcolepsy.   ALLERGIES: No known drug allergies.   SOCIAL HISTORY: No smoking. No alcohol abuse. No illicit drug abuse. Lives at home with his wife.   FAMILY HISTORY: No significant family history of coronary artery disease or diabetes. Both mother and father died from old age.   CURRENT MEDICATIONS:  1. Amitriptyline 50 mg at bedtime as needed.  2. Aspirin 81 mg daily.  3. Baclofen 10 mg four times daily. 4. Calcium carbonate 600 mg twice a day. 5. Celebrex 400 mg daily.  6. Aricept 10 mg at bedtime.  7. Finasteride 5 mg daily.  8. Reglan 10 mg 1/2 tab twice a day. 9. Modafinil 200 mg daily.  10. Morphine 20 mg twice a day. 11. Multivitamin daily.  12. Namenda 10 mg twice a day. 13. Omeprazole 20 mg daily. 14. Pramipexole 0.5 mg daily.  15. Timolol 0.25 ophthalmic drops one drop to each eye in the morning.  16. Tramadol 50 mg at bedtime.   PHYSICAL EXAMINATION:   VITAL SIGNS: Temperature 98.1, pulse 77, respirations 22, blood pressure 123/67, and saturation 96 percent on room air.   GENERAL: He is pleasant appearing male in no apparent distress.   HEENT: Atraumatic, normocephalic. Extraocular muscles are intact. Pupils are equal and reactive to light. Sclerae anicteric. No conjunctival injection. No pharyngeal erythema.   NECK: Supple. No jugular venous distention, no bruits, no lymphadenopathy, and no thyromegaly.   HEART: Regular rate and rhythm. No murmurs, rubs, or clicks.   LUNGS: Clear to auscultation bilaterally. No rales, no rhonchi, and no wheezes.   ABDOMEN: Soft, flat, nontender, and nondistended. Has good bowel sounds. No hepatosplenomegaly appreciated.   EXTREMITIES: No  evidence of any cyanosis, clubbing, or peripheral edema. Has +2 pedal and radial pulses bilaterally. His right lower extremity is shortened and externally rotated due to the hip fracture.   SKIN: Moist and warm with no rashes appreciated.   LYMPHATIC: There is no  cervical or axillary lymphadenopathy.   NEUROLOGIC: Alert, awake, and oriented x3 with no focal motor or sensory deficits appreciated.   LABS/RADIOLOGIC STUDIES: On admission serum glucose was 112, BUN 25, creatinine 1.1, sodium 143, potassium 4.1, chloride 107, and bicarbonate 29. The patient's LFTs are within normal limits. White cell count 5.6, hemoglobin 10.3, hematocrit 29.7, and platelet count 119. INR is 1.0.   The patient did have a chest x-ray done which showed no acute cardiopulmonary disease.   The patient also had an x-ray of the right hip which showed acute subcapital fracture of the right hip.   ASSESSMENT AND PLAN: This is an 81 year old male with history of dementia, benign prostatic hypertrophy, narcolepsy, chronic pain due to scoliosis, gastroesophageal reflux disease, restless leg syndrome, and glaucoma who presents to the hospital after a fall and noted to have a right hip fracture.  1. Preoperative evaluation. The patient likely is a mild to moderate risk for noncardiac surgery. No contraindication to surgery at this time. The patient's EKG does show a left bundle branch block. His previous one in 2009 showed normal sinus rhythm, although the patient is having no chest pain, no shortness of breath, shows no evidence of any acute coronary syndrome or congestive heart failure, and is completely asymptomatic. Therefore, we will not do any further work-up at this time, hold off on perioperative beta blocker as he is currently normotensive.  2. Dementia. We will continue with Aricept and Namenda. 3. Benign prostatic hypertrophy.  Continue finasteride.  4. Gastroesophageal reflux disease. Continue Prilosec.  5. Restless leg syndrome. Continue Requip.  6. Glaucoma. Continue timolol. 7. Narcolepsy. Continue modafinil.   CODE STATUS: THE PATIENT IS A FULL CODE.   Thank you for the consultation. We will follow along with you.   TIME SPENT: 45 minutes.    ____________________________ Rolly PancakeVivek J. Cherlynn KaiserSainani, MD vjs:slb D: 10/23/2011 18:06:38 ET T: 10/24/2011 07:18:36 ET JOB#: [id number]  cc: Rolly PancakeVivek J. Cherlynn KaiserSainani, MD, <Dictator> PCP - Dr. Jon BillingsMorrison [facility] [place] SirenVIVEK J Yanil Dawe MD ELECTRONICALLY SIGNED 10/24/2011 12:28

## 2014-04-20 NOTE — H&P (Signed)
Subjective/Chief Complaint Right hip pain    History of Present Illness 81 year old male stumbled while attempting to step up on a curb and fell, landing on his right hip and side. He complains of right hip pain and was unable to stand or bear weight on the right lower extremity. He denied any other injuries. He denied any loss of consciousness. Prior to the fall he was ambulating without any ambulatory aids.   Past Med/Surgical Hx:  Glaucoma:   Restless leg syndrome:   Benign prostatic hypertrophy:   Narcolepsy:   Dementia/memory problems:   Peripheral Neuropathy:   Excision of thyroid cyst:   Transurethral Resection of Prostate - TURP:   ALLERGIES:  No Known Allergies:   HOME MEDICATIONS: Medication Instructions Status  aspirin 81 mg oral tablet 1 tab(s) orally once a day Active  multivitamin 1 tab(s) orally once a day Active  calcium (as carbonate) 600 mg oral tablet 1 tab(s) orally 2 times a day Active  modafinil 200 mg oral tablet 1 tab(s) orally once a day Active  Namenda 10 mg oral tablet 1 tab(s) orally 2 times a day Active  baclofen 10 mg oral tablet 1 tab(s) orally 4 times a day Active  omeprazole 20 mg oral delayed release capsule 1 cap(s) orally once a day Active  morphine 20 mg/24 hr oral capsule, extended release 1 cap(s) orally 2 times a day Active  finasteride 5 mg oral tablet 1 tab(s) orally once a day Active  donepezil 10 mg oral tablet 1 tab(s) orally once a day (at bedtime) Active  tramadol 50 mg oral tablet 1 tab(s) orally once a day (at bedtime) Active  pramipexole 0.5 mg oral tablet 1 tab(s) orally once a day Active  Celebrex 200 mg oral capsule 2 cap(s) orally once a day Active  metoclopramide 10 mg oral tablet 0.5 tab(s) orally 2 times a day Active  timolol 0.25% ophthalmic solution 1 drop(s) to each eye once a day (in the morning) Active  amitriptyline 25 mg oral tablet 2 tab(s) orally once a day (at bedtime), As Needed for sleep Active   Family and  Social History:   Family History Non-Contributory    Social History negative tobacco, negative ETOH, negative Illicit drugs, married    Place of Living Home   Review of Systems:   Fever/Chills No    Cough No    Sputum No    Abdominal Pain No    Diarrhea No    Constipation No    Nausea/Vomiting No    SOB/DOE No    Chest Pain No    Dysuria No   Physical Exam:   GEN well developed, well nourished, no acute distress    HEENT PERRL, dry oral mucosa, Oropharynx clear    NECK supple  No masses  thyroid not tender    RESP normal resp effort  clear BS  no use of accessory muscles    CARD regular rate  no murmur  no carotid bruits  No LE edema  no JVD  no Rub    ABD denies tenderness  soft  normal BS    EXTR Right lower extremity is shortened and externally rotated. Pain is elicited with attempts at range of motion of the hip. No gross tenderness to palpation of the knee. No gross knee effusion.    SKIN Multiple areas of ecchymosis.    NEURO negative tremor, motor/sensory function intact    PSYCH alert, poor insight   Lab  Results: Hepatic:  22-Oct-13 16:15    Bilirubin, Total 0.7   Alkaline Phosphatase 121   SGPT (ALT) 20   SGOT (AST) 25   Total Protein, Serum 6.8   Albumin, Serum 3.5  Routine Chem:  22-Oct-13 16:15    Glucose, Serum  112   BUN  25   Creatinine (comp) 1.12   Sodium, Serum 143   Potassium, Serum 4.1   Chloride, Serum 107   CO2, Serum 29   Calcium (Total), Serum  8.3   Osmolality (calc) 290   eGFR (African American) >60   eGFR (Non-African American)  59 (eGFR values <25m/min/1.73 m2 may be an indication of chronic kidney disease (CKD). Calculated eGFR is useful in patients with stable renal function. The eGFR calculation will not be reliable in acutely ill patients when serum creatinine is changing rapidly. It is not useful in  patients on dialysis. The eGFR calculation may not be applicable to patients at the low and high extremes  of body sizes, pregnant women, and vegetarians.)   Anion Gap 7  Routine Coag:  22-Oct-13 16:15    Activated PTT (APTT) 32.1 (A HCT value >55% may artifactually increase the APTT. In one study, the increase was an average of 19%. Reference: "Effect on Routine and Special Coagulation Testing Values of Citrate Anticoagulant Adjustment in Patients with High HCT Values." American Journal of Clinical Pathology 2006;126:400-405.)   Prothrombin 14.0   INR 1.0 (INR reference interval applies to patients on anticoagulant therapy. A single INR therapeutic range for coumarins is not optimal for all indications; however, the suggested range for most indications is 2.0 - 3.0. Exceptions to the INR Reference Range may include: Prosthetic heart valves, acute myocardial infarction, prevention of myocardial infarction, and combinations of aspirin and anticoagulant. The need for a higher or lower target INR must be assessed individually. Reference: The Pharmacology and Management of the Vitamin K  antagonists: the seventh ACCP Conference on Antithrombotic and Thrombolytic Therapy. CZMOQH.4765Sept:126 (3suppl): [id number] A HCT value >55% may artifactually increase the PT.  In one study,  the increase was an average of 25%. Reference:  "Effect on Routine and Special Coagulation Testing Values of Citrate Anticoagulant Adjustment in Patients with High HCT Values." American Journal of Clinical Pathology 2006;126:400-405.)  Routine Hem:  22-Oct-13 16:15    WBC (CBC) 5.6   RBC (CBC)  3.42   Hemoglobin (CBC)  10.3   Hematocrit (CBC)  29.7   Platelet Count (CBC)  119 (Result(s) reported on 23 Oct 2011 at 04:23PM.)   MCV 87   MCH 30.3   MCHC 34.8   RDW  14.7   Radiology Results: XRay:    22-Oct-13 15:52, Hip Right Complete   Hip Right Complete   REASON FOR EXAM:    pain, injury, fall  COMMENTS:       PROCEDURE: DXR - DXR HIP RIGHT COMPLETE  - Oct 23 2011  3:52PM     RESULT: Three views of the  right hip are submitted. The patient has   sustained an acute angulated subcapital fracture. The intertrochanteric   region is intact. The observed portions of the right hemipelvis exhibit   no acute fractures. There is diffuse osteopenia.    IMPRESSION:  The patient has sustained an acute subcapital fracture of   the right hip.     Dictation Site: 2      Verified By: DAVID A. JMartinique M.D., MD     Assessment/Admission Diagnosis Displaced right femoral neck fracture  Plan Recommended right hip hemiarthroplasty. The risks and benefits of surgical intervention were discussed in detail with the patient. The patient expressed understanding of the risks and benefits and agreed with plans for surgery.  The potential risks and benefits of blood transfusion have been discussed with the patient.The patient expressed understanding of the risks and benefits and has signed the appropriate consent for blood transfusion.   Surgical site signed as per "right site surgery" protocol.   Electronic Signatures: Dereck Leep (MD)  (Signed 22-Oct-13 20:03)  Authored: CHIEF COMPLAINT and HISTORY, PAST MEDICAL/SURGIAL HISTORY, ALLERGIES, HOME MEDICATIONS, FAMILY AND SOCIAL HISTORY, REVIEW OF SYSTEMS, PHYSICAL EXAM, LABS, Radiology, ASSESSMENT AND PLAN   Last Updated: 22-Oct-13 20:03 by Dereck Leep (MD)

## 2014-04-20 NOTE — Consult Note (Signed)
Brief Consult Note: Diagnosis: 1. Pre-operative eval 2. Dementia 3. BPH 4. Narcolepsy 5. GERD 6. Restless leg syndrome 7. Glaucoma.   Consult note dictated.   Recommend to proceed with surgery or procedure.   Orders entered.   Comments: 81 yo male w/ hx of dementia, BPH, Narcolepsy, chronic pain due to scoliosis, GERD, Restless leg syndrome, Glaucoma came into hospital after a fall and noted to have a right hip fracture.   1. Pre-operative eval - likely mild to moderate risk for non-cardiac surgery.  - no contraindications to surgery at this time.  - ECG reviewed and showed LBBB and previous one in 2009 showed NSR.  NO hx of CAD and currently asymptomatic and no further work up needed at this time. Hold off on Peri-operative B-blocker.  2. Dementia - cont. Aricept, Namenda 3. BPH - cont. finasteride 4. GERD - cont. prilosec 5. Restless leg syndrome - cont. requip.  6. Glaucoma - cont. Timolol.   Thanks for the consult and will follow with you.  Job # [id number].  Electronic Signatures: [place] SirenSainani, Porshia Blizzard J (MD)  (Signed 22-Oct-13 18:06)  Authored: Brief Consult Note   Last Updated: 22-Oct-13 18:06 by [place] SirenSainani, Rondall Radigan J (MD)

## 2014-04-20 NOTE — Op Note (Signed)
PATIENT NAME:  Brett Haney, Brett Haney MR#:  [id number] DATE OF BIRTH:  [birth year]  DATE OF PROCEDURE:  10/24/2011  PREOPERATIVE DIAGNOSIS: Displaced right femoral neck fracture.   POSTOPERATIVE DIAGNOSIS: Displaced right femoral neck fracture.   PROCEDURE PERFORMED: Right hip hemiarthroplasty.   SURGEON: Illene Labrador. Hooten, MD  ANESTHESIA: Spinal.   ESTIMATED BLOOD LOSS: 700 mL.   FLUIDS REPLACED: 2800 mL of crystalloid.   DRAINS: Two medium drains to Hemovac reservoir.   IMPLANTS UTILIZED: DePuy size 6 Summit femoral stem (cemented), 12 mm distal cementralizer, 54 mm outer diameter Cathcart hip ball, +5 mm tapered spacer, and a size 4 cement restrictor.   INDICATIONS FOR SURGERY: The patient is an 81 year old male who fell on the day of admission and landed on his right hip and leg. X-rays demonstrated a displaced right femoral neck fracture. After discussion of the risks and benefits of surgical intervention, the patient and his wife expressed understanding of the risks and benefits and agreed with plans for surgical intervention.   PROCEDURE IN DETAIL: The patient was brought to the Operating Room and, after adequate spinal anesthesia was achieved, the patient was placed in a left lateral decubitus position. Axillary roll was placed and all bony prominences were well padded. The patient's right hip and leg were cleaned and prepped with alcohol and DuraPrep and draped in the usual sterile fashion. A "time out" was performed as per usual protocol. A lateral curvilinear incision was made gently curving towards the posterior superior iliac spine. IT band was incised in line with the skin incision and the fibers of the gluteus maximus were split in line. Piriformis tendon was identified, skeletonized, and incised at its insertion on the proximal femur and reflected posteriorly. In a similar fashion, short external rotators were incised and reflected posteriorly. A T-type posterior capsulotomy was performed. A  moderate hemarthrosis was evacuated. There was gross comminution of the femoral neck. The femoral head was removed using a corkscrew device and measured using calipers. It was felt that a 54 mm outer diameter Cathcart hip ball was  appropriate. The acetabulum was inspected for any fracture fragments. The articular surface was felt to be in good condition. Femoral neck cut was performed using an oscillating saw. Pilot hole for preparation of the proximal femur was created and a conical reamer was inserted. This was followed by insertion of serial broaches up to a size 6. Good fit was achieved and the calcar region was planed accordingly. Trial reduction was performed with a 54 mm hip ball with first a +0 and subsequently a +5 mm neck length. This allowed for good equalization of limb lengths and restoration of hip offset. The hip was then dislocated and trial components were removed. The femoral canal was sized and it was felt that a size 4 cement extractor was appropriate. The cement restrictor was inserted to the appropriate depth. Proximal femoral canal was irrigated with copious amounts of normal saline with antibiotic solution using pulsatile lavage and then suctioned dry. The proximal femoral canal was then packed with vaginal packing soaked in dilute Neo-Synephrine. Polymethyl methacrylate cement was prepared in the usual fashion using a vacuum mixer. The vaginal packing was removed and the canal again irrigated and suctioned dry. Cement was introduced in a retrograde fashion and then pressurized. A size 6 Summit femoral stem with a 12 mm distal cementralizer was positioned and impacted into place. Excess cement was removed using freer elevators. After adequate curing of the cement, the Morse taper was cleaned  and dried. A 54 mm Cathcart hip ball with a +5 mm tapered spacer was placed on the trunnion and impacted into place. The acetabulum was again irrigated and suctioned dry. The Cathcart hip ball was then  reduced and placed through range of motion. Excellent stability was appreciated both anteriorly and posteriorly. Good hip offset was noted.   The wound was irrigated with copious amounts of normal saline with antibiotic solution using pulsatile lavage and then suctioned dry. Good hemostasis was appreciated. The posterior capsulotomy was repaired using #5 Ethibond. The piriformis tendon was reapproximated on the undersurface of the gluteus medius tendon using #5 Ethibond. Two medium drains were placed in the wound bed and brought out through a separate stab incision to be attached to a Hemovac reservoir. The IT band was repaired using interrupted sutures of #1 Vicryl. The subcutaneous tissue was approximated in layers using first #0 Vicryl followed by #2-0 Vicryl. Skin was closed with skin staples. Sterile dressing was applied.        The patient tolerated procedure well. He was transported to the recovery room in stable condition. ____________________________ Illene LabradorJames P. Angie FavaHooten Jr., MD jph:slb D: 10/24/2011 23:03:29 ET T: 10/25/2011 09:40:19 ET JOB#: [id number]  cc: Fayrene FearingJames P. Angie FavaHooten Jr., MD, <Dictator> JAMES P Angie FavaHOOTEN JR MD ELECTRONICALLY SIGNED 10/31/2011 6:23

## 2014-04-20 NOTE — Discharge Summary (Signed)
PATIENT NAME:  Brett Haney, Brett Haney MR#:  [id number] DATE OF BIRTH:  [birth year]  DATE OF ADMISSION:  10/23/2011 DATE OF DISCHARGE:  10/29/2011  ADMITTING DIAGNOSIS: Displaced right femoral neck fracture.   DISCHARGE DIAGNOSIS: Displaced right femoral neck fracture.   CONSULTATION: Hospitalist, Dr. Cherlynn Kaiser.    HISTORY: Patient is a pleasant 81 year old who stumbled and fell while attempting to step on a curb. In the process he fell landing on the right hip and side. He was noted to have immediate pain to the right hip and was unable to stand or bear weight on the right lower extremity. He subsequently was brought to [facility] ER. He denied any other injuries or any loss of consciousness. Prior to this fall patient was ambulating without any ambulatory aid. X-rays taken in the Emergency Room revealed a displaced right femoral neck fracture. After discussion of the risks and benefits of surgical intervention, the patient expressed his understanding of the risks and benefits and agreed for plans for surgical intervention. Dr. Cherlynn Kaiser was contacted for medical clearance for which patient was cleared.   PROCEDURE: Right hip hemiarthroplasty.   ANESTHESIA: Spinal.   IMPLANTS UTILIZED: DePuy size 6 Summit femoral stem (cemented), 12 mm distal centralizer, 54 mm outer diameter Cathcart hip ball, +5 mm tapered spacer, and a size 4 cement restrictor.   HOSPITAL COURSE: Patient tolerated the procedure very well. He had no complications. He was given 1 unit of packed RBCs during the surgery. He was then taken to PAC-U where he was stabilized and then transferred to the orthopedic floor. Patient began receiving anticoagulation therapy of Lovenox 30 mg subcutaneous every 12 hours per anesthesia and pharmacy protocol. He was fitted with TED stockings bilaterally. These were allowed to be removed one hour per eight hour shift. Patient was also fitted with the AV-I compression foot pumps bilaterally  set at 80 mmHg. His calves have been nontender. There has been no evidence of any deep venous thromboses to the lower extremity. Heels were elevated off the bed using rolled towels. Patient has been alert and oriented.   Patient has denied any chest pain or shortness of breath. Vital signs have been stable. He has been afebrile. Hemodynamically he did receive 1 unit of packed RBCs in the Operating Room and a second one on day three with his hemoglobin of 7.2. Follow up upon discharge was 8.1. Patient was asymptomatic during this time frame.   Physical therapy was initiated on day one for gait training and transfers. He has done very well. He has been a little on the slow side. Occupational therapy was also initiated on day one for activities of daily living and assistive devices.   Patient's IV, Foley and Hemovac were discontinued on day two along with a dressing change. The wound was free of any drainage or signs of infection.   Patient is being discharged to skilled nursing facility in improved stable condition.   DISCHARGE INSTRUCTIONS:  1. He may weight bear as tolerated.  2. Continue physical therapy for gait training and transfers.  3. Occupational therapy for activities of daily living and assistive devices. Abduction pillows when in bed.  4. Elevate heels off the bed.  5. TED stockings are to be worn at all times. These may be removed one hour per eight hour shift.  6. Incentive spirometer q.1 hour while awake.  7. Encourage cough, deep breathing every two hours while awake.  8. He is placed on a regular diet.  9.  He will need to follow up in the clinic in six weeks with Dr. Ernest PineHooten.  10. Staples are to be removed on 11/07/2011 and apply half-inch Steri-Strips with benzoin.   ALLERGIES: No known drug allergies.   MEDICATIONS:  1. Tylenol ES 500 to 1000 mg every four hours p.r.n. for pain or temperatures greater than 100.. 2. Roxicodone 5 to 10 mg every four hours p.r.n.  3. Ultram  50 to 100 mg every four hours p.r.n.  4. Senokot-S 1 tablet b.i.d.  5. Milk of magnesia 30 mL b.i.d. p.r.n.  6. Dulcolax suppositories 10 mg rectally daily p.r.n.  7. Enema soapsuds if no results with milk of magnesia or Dulcolax daily p.r.n.  8. Mylanta DS 30 mL every six hours p.r.n.  9. Pantoprazole 40 mg b.i.d.  10. Proscar 5 mg daily.  11. Calcium carbonate 2 tablets 500 mg b.i.d.  12. Amitriptyline 50 mg at bedtime p.r.n.  13. Mirapex 0.5 mg usual schedule every day 21:00. 14. Aricept 10 mg at bedtime.  15. Provigil 100 mg daily.  16. Namenda 10 mg b.i.d.  17. Baclofen 10 mg t.i.d.  18. Timoptic 0.5% ophthalmic drops one drop both eyes daily.  19. Multivitamin capsule 1 daily.  20. Lovenox 30 mg subcutaneous q.12 hours for 14 days, then discontinue and begin taking one 81 mg enteric-coated aspirin.  21. Kadian 20 mg every 24 hours, this is patient's own medication.   PAST MEDICAL HISTORY:  1. Glaucoma. 2. Restless leg syndrome. 3. Benign prostatic hypertrophy.  4. Narcolepsy. 5. Dementia/memory loss.  6. Peripheral neuropathy. 7. Excision of thyroid cyst.  8. Transurethral resection of prostate.  ____________________________ Van ClinesJon Wolfe, PA jrw:cms D: 10/29/2011 06:46:44 ET T: 10/29/2011 07:20:18 ET JOB#: [id number]  cc: Van ClinesJon Wolfe, PA, <Dictator>  JON WOLFE PA ELECTRONICALLY SIGNED 10/30/2011 7:18
